# Patient Record
Sex: Female | Born: 1990 | Race: White | Hispanic: No | Marital: Single | Smoking: Current every day smoker
Health system: Southern US, Community
[De-identification: ages and names within clinical notes are randomized; demographics above are authoritative.]

## PROBLEM LIST (undated history)

## (undated) DIAGNOSIS — N83209 Unspecified ovarian cyst, unspecified side: Secondary | ICD-10-CM

## (undated) DIAGNOSIS — J4541 Moderate persistent asthma with (acute) exacerbation: Secondary | ICD-10-CM

---

## 2017-10-28 ENCOUNTER — Emergency Department (HOSPITAL_COMMUNITY)
Admission: EM | Admit: 2017-10-28 | Discharge: 2017-10-28 | Disposition: A | Discharge status: Home / Self Care | Payer: BLUE CROSS/BLUE SHIELD | Attending: Emergency Medicine | Admitting: Emergency Medicine

## 2017-10-28 ENCOUNTER — Other Ambulatory Visit: Payer: Self-pay

## 2017-10-28 ENCOUNTER — Encounter (HOSPITAL_COMMUNITY): Payer: Self-pay | Admitting: Emergency Medicine

## 2017-10-28 DIAGNOSIS — Z973 Presence of spectacles and contact lenses: Secondary | ICD-10-CM | POA: Diagnosis not present

## 2017-10-28 DIAGNOSIS — X58XXXA Exposure to other specified factors, initial encounter: Secondary | ICD-10-CM | POA: Insufficient documentation

## 2017-10-28 DIAGNOSIS — S0502XA Injury of conjunctiva and corneal abrasion without foreign body, left eye, initial encounter: Secondary | ICD-10-CM | POA: Diagnosis not present

## 2017-10-28 DIAGNOSIS — Y998 Other external cause status: Secondary | ICD-10-CM | POA: Diagnosis not present

## 2017-10-28 DIAGNOSIS — S0592XA Unspecified injury of left eye and orbit, initial encounter: Secondary | ICD-10-CM | POA: Diagnosis present

## 2017-10-28 DIAGNOSIS — Y9389 Activity, other specified: Secondary | ICD-10-CM | POA: Diagnosis not present

## 2017-10-28 DIAGNOSIS — Y929 Unspecified place or not applicable: Secondary | ICD-10-CM | POA: Diagnosis not present

## 2017-10-28 HISTORY — DX: Unspecified ovarian cyst, unspecified side: N83.209

## 2017-10-28 MED ORDER — ERYTHROMYCIN 5 MG/GM OP OINT
TOPICAL_OINTMENT | Freq: Once | OPHTHALMIC | Status: AC
  Administered 2017-10-28: 1 via OPHTHALMIC
  Filled 2017-10-28: qty 3.5

## 2017-10-28 MED ORDER — CIPROFLOXACIN HCL 0.3 % OP SOLN
2.0000 [drp] | Freq: Once | OPHTHALMIC | Status: AC
  Administered 2017-10-28: 2 [drp] via OPHTHALMIC
  Filled 2017-10-28: qty 2.5

## 2017-10-28 MED ORDER — FLUORESCEIN SODIUM 1 MG OP STRP
1.0000 | ORAL_STRIP | Freq: Once | OPHTHALMIC | Status: AC
  Administered 2017-10-28: 1 via OPHTHALMIC
  Filled 2017-10-28: qty 1

## 2017-10-28 MED ORDER — TETRACAINE HCL 0.5 % OP SOLN
2.0000 [drp] | Freq: Once | OPHTHALMIC | Status: AC
  Administered 2017-10-28: 2 [drp] via OPHTHALMIC
  Filled 2017-10-28: qty 4

## 2017-10-28 NOTE — ED Triage Notes (Signed)
Pt verbalizes slept in contacts last night and believes scratched her left eye. Redness noted to conjunctiva.

## 2017-10-28 NOTE — ED Notes (Signed)
Eye flushed with LR and Morgan Lens.

## 2017-10-28 NOTE — Discharge Instructions (Addendum)
You were seen here today for left eye pain.  You were  found to have a corneal abrasion.  I have given you ciprofloxacin drops that I would like you to apply 4 times a day (every 6 hours) for the next 5 days.  Please do not wear your contacts in following days.  Please follow-up with the eye doctor provided in 2 days, on 10/30/2017. Please follow attached handout.

## 2017-10-28 NOTE — ED Notes (Signed)
Pt unable to complete

## 2017-10-28 NOTE — ED Provider Notes (Signed)
[facility]-EMERGENCY DEPT Provider Note   CSN: [id number] Arrival date & time: 10/28/17  0945     History   Chief Complaint Chief Complaint  Patient presents with  . Eye Pain    HPI Rachel Morales is a 26 y.o. female who presents to the emergency department today for left eye pain.  Patient notes that she has daily contact and excellently slept in her contacts yesterday.  She awoke this morning with irritation, photophobia and pain in the left eye. She is now having tearing in the eye as a result of the the pain and mild foreign body sensation.  She took out the contact that made her pain worse.  She tried eyedrops that did not relieve her symptoms.  She put back in daily contacts that did not improve her pain.  She is currently having a 7/10 irritating pain. She denies fever, HA, N/V, loss of vision, flashers, floaters, blurring, diplopia, discharge, trauma, rash, pain or painful EOM.    HPI  Past Medical History:  Diagnosis Date  . Ovarian cyst     There are no active problems to display for this patient.   History reviewed. No pertinent surgical history.  OB History    No data available       Home Medications    Prior to Admission medications   Not on File    Family History No family history on file.  Social History Social History   Tobacco Use  . Smoking status: Not on file  Substance Use Topics  . Alcohol use: Not on file  . Drug use: Not on file     Allergies   Tamiflu [oseltamivir]   Review of Systems Review of Systems  Constitutional: Negative for chills and fever.  Eyes: Positive for photophobia, pain, redness and visual disturbance.  Gastrointestinal: Negative for nausea and vomiting.  Neurological: Negative for headaches.     Physical Exam Updated Vital Signs BP 133/77 (BP Location: Left Arm)   Pulse 87   Temp 98.4 F (36.9 C) (Oral)   Resp 16   Ht 5\' 4"  (1.626 m)   Wt 59 kg (130 lb)   LMP 10/07/2017   SpO2  97%   BMI 22.31 kg/m   Physical Exam  Constitutional: She appears well-developed and well-nourished.  HENT:  Head: Normocephalic and atraumatic.  Right Ear: External ear normal.  Left Ear: External ear normal.  No periorbital swelling or erythema  Eyes: Conjunctivae are normal. Lids are everted and swept, no foreign bodies found. Right eye exhibits no discharge. Left eye exhibits no discharge. No scleral icterus.  Appearance. Left eye with scleral erythema. Right eye with no erythema or scleral icterus. No discharge.  PEERL intact. EOMI without nystagmus. Photophobia to left eye. No consensual photophobia.  Corneal Abrasion Exam VCO. Risks, benefits and alternatives explained. 2 drops of tetracaine (PONTOCAINE) 0.5 % ophthalmic solution  were applied to the left eye. Fluorescein 1 MG ophthalmic strip applied the the surface of the left eye Slit lamp used to screen for abrasion. Noted increased fluorescein uptake on the right sclera, near the edge of iris. No corneal ulcer. No hyphema. No dendritic lesion. Negative Seidel sign. No foreign bodies noted. No visible hyphema.  Eye flushed with sterile saline Patient tolerated the procedure well TONOPEN: 18.9 LEFT  Pulmonary/Chest: Effort normal. No respiratory distress.  Neurological: She is alert.  Skin: No pallor.  Psychiatric: She has a normal mood and affect.  Nursing note and vitals reviewed.  ED Treatments / Results  Labs (all labs ordered are listed, but only abnormal results are displayed) Labs Reviewed - No data to display  EKG  EKG Interpretation None       Radiology No results found.  Procedures Procedures (including critical care time)  Medications Ordered in ED Medications  ciprofloxacin (CILOXAN) 0.3 % ophthalmic solution 2 drop (not administered)  tetracaine (PONTOCAINE) 0.5 % ophthalmic solution 2 drop (2 drops Left Eye Given 10/28/17 1025)  fluorescein ophthalmic strip 1 strip (1 strip Left Eye Given  10/28/17 1025)  erythromycin ophthalmic ointment (1 application Left Eye Given 10/28/17 1024)     Initial Impression / Assessment and Plan / ED Course  I have reviewed the triage vital signs and the nursing notes.  Pertinent labs & imaging results that were available during my care of the patient were reviewed by me and considered in my medical decision making (see chart for details).     Corneal abrasion  Pt with corneal abrasion on PE. Eye irrigated w NS, no evidence of FB.  No change in vision, acuity equal bilaterally.  Pt is a contact lens wearer.  Exam non-concerning for orbital cellulitis, hyphema, corneal ulcers. Patient will be discharged home with ciprofloxacin drops (given in the department) for pseudomonas coverage.   Patient understands to follow up with ophthalmology, & to return to ER if new symptoms develop including change in vision, purulent drainage, or entrapment. Appear safe for discharge.   Final Clinical Impressions(s) / ED Diagnoses   Final diagnoses:  Abrasion of left cornea, initial encounter    ED Discharge Orders    None       Princella PellegriniMaczis, Michael M, PA-C 10/28/17 1041    Gerhard MunchLockwood, Robert, MD 10/28/17 1102

## 2017-11-09 ENCOUNTER — Ambulatory Visit
Admission: RE | Admit: 2017-11-09 | Discharge: 2017-11-09 | Disposition: A | Discharge status: Home / Self Care | Payer: Self-pay | Source: Ambulatory Visit | Attending: Family Medicine | Admitting: Family Medicine

## 2017-11-09 ENCOUNTER — Ambulatory Visit: Payer: Self-pay | Admitting: Family Medicine

## 2017-11-09 ENCOUNTER — Encounter: Payer: Self-pay | Admitting: Family Medicine

## 2017-11-09 ENCOUNTER — Ambulatory Visit: Payer: BLUE CROSS/BLUE SHIELD | Admitting: Pharmacist

## 2017-11-09 ENCOUNTER — Other Ambulatory Visit: Payer: Self-pay

## 2017-11-09 VITALS — BP 102/68 | HR 66 | Temp 98.2°F | Ht 64.0 in | Wt 121.6 lb

## 2017-11-09 DIAGNOSIS — Y9315 Activity, underwater diving and snorkeling: Secondary | ICD-10-CM | POA: Insufficient documentation

## 2017-11-09 DIAGNOSIS — Z021 Encounter for pre-employment examination: Secondary | ICD-10-CM

## 2017-11-09 LAB — POCT UA - GLUCOSE/PROTEIN
GLUCOSE UA: NEGATIVE
Protein, UA: 30

## 2017-11-09 LAB — POCT HEMOGLOBIN: Hemoglobin: 14.4 g/dL (ref 12.2–16.2)

## 2017-11-09 NOTE — Progress Notes (Signed)
S:    Patient arrives in good spirits, ambulating without assistance and reporting she is just overcoming a cold and .    Presents for lung function evaluation for "dive physical". Patient reports breathing has recovered.  Reports history of well controlled asthma.    O: Patient provided good effort while attempting spirometry.  FVC 3.22    Calculated Lower Limit for [employer] Diving Standards   FVC = 3.10 FEV1 2.97       Calculated Lower Limit for [employer] Diving Standards   FEV1= 2.66 FEV1/FVC  92.3   Calculated Lower Limit for [employer] Diving Standards   FEV1/FVC = 75.4  See "scanned report" or Documentation Flowsheet (discrete results - PFTs) for  Spirometry results and copy of evaluation.   A/P:  Spirometry evaluation without bronchodilator reveals normal lung function.  FEV1, FVC and FEV1/FVC ratio all exceed threshold for spirometric parameters.

## 2017-11-09 NOTE — Progress Notes (Signed)
Subjective:    Rachel Morales is a 26 y.o. female who [facility] for [facility]:  1.  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  Denies any complications or injuries while diving.  Has never failed a fitness to dive physical.  Currently well, without complaints.  The following portions of the patient's history were reviewed and updated as appropriate: allergies, current medications, past medical history, family and social history, and problem list.  PMH reviewed.  Past Medical History:  Diagnosis Date  . Ovarian cyst    No past surgical history on file.  Medications reviewed. Current Outpatient Medications  Medication Sig Dispense Refill  . ALBUTEROL SULFATE IN Inhale 2 puffs into the lungs as needed.    . fluticasone (FLOVENT HFA) 220 MCG/ACT inhaler Inhale 2 puffs into the lungs 2 (two) times daily.    Marland Kitchen. moxifloxacin (VIGAMOX) 0.5 % ophthalmic solution Place 1 drop into the left eye 2 (two) times daily.    . prednisoLONE acetate (PRED FORTE) 1 % ophthalmic suspension Place 1 drop into the left eye 3 (three) times daily.    . TRI-PREVIFEM 0.18/0.215/0.25 MG-35 MCG tablet Take 1 tablet by mouth daily.     No current facility-administered medications for this visit.      PMH:   - asthma on controller medications.  Uses albuterol once a week at most.   - History of anxiety and depression, currently controlled.   - No other hospitalizations or other prior medical history   PSH: - denies  Family History: - Father with asthma.  Otherwise denies any family history of cardiopulmonary disease.  Social: -Rarely smokes cigarettes on occasion when drinking socially. - Very occasional social drinker (1-2 drinks)  SCUBA ROS:  He denies any history of middle ear trauma/disease, vertigo, ocular surgery,  asthma or other respiratory issues, seizures, loss of consciousness, recurring neurologic disorders, history of head injury, coagulopathies, evidence of CAD or other structural heart disease, pneumothorax, diabetes, or exercise intolerance.    General ROS:  The patient denies fever, unusual weight change, decreased hearing, chest pain, palpitations, pre-syncopal or syncopal episodes, dyspnea on exertion, prolonged cough, hemoptysis, change in bowel habits, melena, hematochezia, severe indigestion/heartburn, nausea/vomiting/abdominal pain, genital sores, muscle weakness, difficulty walking, abnormal bleeding, or enlarged lymph nodes.     Objective:   Physical Exam BP 102/68   Pulse 66   Temp 98.2 F (36.8 C) (Oral)   Ht 5\' 4"  (1.626 m)   Wt 121 lb 9.6 oz (55.2 kg)   LMP 11/09/2017 (Exact Date)   SpO2 98%   BMI 20.87 kg/m  Gen:  Alert, cooperative patient who appears stated age in no acute distress.  Vital signs reviewed. Head:  [place]/AT Eyes:  Fundoscopy WNL BL.  PERRL, EOMI Ears:  External ears WNL, Bilateral TM's normal without retraction, redness or bulging.  Canals clear BL  Mouth:  Good dental hygiene. Tonsils non-erythematous, non-edematous.   MMM Neck:  Trachea midline Cardiac:  Regular rate and rhythm without murmur auscultated.   Pulm:  Clear to auscultation bilaterally with good air movement throuhout.  No wheezes or rales noted.   Abd:  Soft/nondistended Exts: No edema BL LE's, warm and well-perfused Neuro:  Alert and oriented to person, place, and date.  CN II-XII intact.  Sensation intact to light touch and vibration bilateral upper  and lower extremities equally.  Motor function equal and strength 5/5 bilateral upper and lower extremities.  Normal gait.  DTRs +2 BL tricep, brachialis, patellar, and achilles.  Finger to nose cerebellar testing within normal limits.  Color vision testing normal. Psych:  Not depressed or anxious appearing.  Linear and coherent thought process as  evidenced by speech pattern. Smiles spontaneously.

## 2017-11-09 NOTE — Patient Instructions (Signed)
It was good to see you today  [facility] to [facility].  By the time you come back here, I will have seen the results.   Everything looks good thus far.  If the xray is good I will fill out your paperwork.   Have a good holiday and New Year!

## 2017-11-09 NOTE — Assessment & Plan Note (Addendum)
Vision (distance, near, color), hearing, CBG, Hgb, and Spirometry all within normal limits. UA showed 30 protein.  She hasn't had anything to drink today and states she feels very dehydrated.  BP good -- no history of diabetes or HTN. Normal CXR within past 5 years. EKG:  n/a Coronary assessment:  N/a due to age Approval for SCUBA diving, I find no medical conditions considered incompatible with diving. Due to age and lack of medical conditions, she qualifies for 1 year certification.  FU in 1 year for recheck proteinuria.

## 2017-12-13 ENCOUNTER — Encounter: Payer: Self-pay | Admitting: Family Medicine

## 2017-12-13 ENCOUNTER — Other Ambulatory Visit: Payer: Self-pay

## 2017-12-13 ENCOUNTER — Ambulatory Visit (INDEPENDENT_AMBULATORY_CARE_PROVIDER_SITE_OTHER): Payer: Self-pay | Admitting: Family Medicine

## 2017-12-13 VITALS — BP 110/72 | HR 66 | Temp 98.5°F | Ht 66.93 in | Wt 120.6 lb

## 2017-12-13 DIAGNOSIS — R6889 Other general symptoms and signs: Secondary | ICD-10-CM

## 2017-12-13 LAB — POC INFLUENZA A&B (BINAX/QUICKVUE)
Influenza A, POC: NEGATIVE
Influenza B, POC: NEGATIVE

## 2017-12-13 NOTE — Progress Notes (Signed)
1/24/201911:54 AM  Rachel Morales 10-Apr-1991, 27 y.o. female [id number]  Chief Complaint  Patient presents with  . URI    having flu like symptom    HPI:   Patient is a 27 y.o. female who presents today for 2 days of cough, body aches, headaches, cold and hot sweats, nausea and vomiting. Mild sore throat and nasal congestion. No SOB or diarrhea. Tolerating small sips of fluids. Works at a [employer]. Has not had flu vaccine this season. Reminds her of when she had the flu couple of years ago.   Depression screen [facility] 2/9 12/13/2017 11/09/2017  Decreased Interest 0 0  Down, Depressed, Hopeless 0 0  PHQ - 2 Score 0 0    Allergies  Allergen Reactions  . Peanut Oil Other (See Comments)    Reports tongue and throat involvement.  . Tamiflu [Oseltamivir] Other (See Comments)    Dehydration    Prior to Admission medications   Medication Sig Start Date End Date Taking? Authorizing Provider  ALBUTEROL SULFATE IN Inhale 2 puffs into the lungs as needed.    [provider]  fluticasone (FLOVENT HFA) 220 MCG/ACT inhaler Inhale 2 puffs into the lungs 2 (two) times daily.    [provider]  TRI-PREVIFEM 0.18/0.215/0.25 MG-35 MCG tablet Take 1 tablet by mouth daily. 11/07/17   [provider]  Lamotrigene 25mg   1 tablet by mouth once a day  Past Medical History:  Diagnosis Date  . Ovarian cyst     History reviewed. No pertinent surgical history.  Social History   Tobacco Use  . Smoking status: Current Every Day Smoker    Types: Cigarettes  . Smokeless tobacco: Never Used  . Tobacco comment: smokes with alcohol rarely  Substance Use Topics  . Alcohol use: Not on file    Family History  Problem Relation Age of Onset  . Mental illness Mother   . Healthy Father   . Mental illness Sister   . Healthy Brother     ROS Per hpi  OBJECTIVE:  Blood pressure 110/72, pulse 66, temperature 98.5 F (36.9 C), temperature source Oral, height 5' 6.93"  (1.7 m), weight 120 lb 9.6 oz (54.7 kg), last menstrual period 11/29/2017, SpO2 95 %.  Physical Exam  Constitutional: She is oriented to person, place, and time and well-developed, well-nourished, and in no distress. She has a sickly appearance.  HENT:  Head: Normocephalic and atraumatic.  Right Ear: Hearing, tympanic membrane, external ear and ear canal normal.  Left Ear: Hearing, tympanic membrane, external ear and ear canal normal.  Mouth/Throat: Oropharynx is clear and moist.  Eyes: EOM are normal. Pupils are equal, round, and reactive to light.  Neck: Neck supple.  Cardiovascular: Normal rate, regular rhythm and normal heart sounds. Exam reveals no gallop and no friction rub.  No murmur heard. Pulmonary/Chest: Effort normal and breath sounds normal. She has no wheezes. She has no rales.  Abdominal: Soft. Bowel sounds are normal. There is no hepatosplenomegaly. There is tenderness in the epigastric area. There is no rebound and no guarding.  Lymphadenopathy:    She has no cervical adenopathy.  Neurological: She is alert and oriented to person, place, and time. Gait normal.  Skin: Skin is warm and dry. No rash noted.    Results for orders placed or performed in visit on 12/13/17 (from the past 24 hour(s))  POC Influenza A&B(BINAX/QUICKVUE)     Status: None   Collection Time: 12/13/17 12:06 PM  Result Value Ref  Range   Influenza A, POC Negative Negative   Influenza B, POC Negative Negative    ASSESSMENT and PLAN  1. Flu-like symptoms - POC Influenza A&B(BINAX/QUICKVUE)  Return if symptoms worsen or fail to improve.    Myles LippsIrma M Santiago, MD [facility] at [facility] [address] [place], [place] [address] Ph.  [phone] Fax [phone]

## 2017-12-13 NOTE — Patient Instructions (Addendum)
IF you received an x-ray today, you will receive an invoice from [facility]. Please contact [facility] at [phone] with questions or concerns regarding your invoice.   IF you received labwork today, you will receive an invoice from [facility]. Please contact [facility] at [phone] with questions or concerns regarding your invoice.   Our billing staff will not be able to assist you with questions regarding bills from these companies.  You will be contacted with the lab results as soon as they are available. The fastest way to get your results is to activate your My Chart account. Instructions are located on the last page of this paperwork. If you have not heard from [place] regarding the results in 2 weeks, please contact this office.        IF you received an x-ray today, you will receive an invoice from [facility]. Please contact [facility] at [phone] with questions or concerns regarding your invoice.   IF you received labwork today, you will receive an invoice from [facility]. Please contact [facility] at [phone] with questions or concerns regarding your invoice.   Our billing staff will not be able to assist you with questions regarding bills from these companies.  You will be contacted with the lab results as soon as they are available. The fastest way to get your results is to activate your My Chart account. Instructions are located on the last page of this paperwork. If you have not heard from [place] regarding the results in 2 weeks, please contact this office.     Viral Illness, Adult Viruses are tiny germs that can get into a person's body and cause illness. There are many different types of viruses, and they cause many types of illness. Viral illnesses can range from mild to severe. They can affect various parts of the body. Common illnesses that are caused by a virus include colds and the flu. Viral illnesses also include serious  conditions such as HIV/AIDS (human immunodeficiency virus/acquired immunodeficiency syndrome). A few viruses have been linked to certain cancers. What are the causes? Many types of viruses can cause illness. Viruses invade cells in your body, multiply, and cause the infected cells to malfunction or die. When the cell dies, it releases more of the virus. When this happens, you develop symptoms of the illness, and the virus continues to spread to other cells. If the virus takes over the function of the cell, it can cause the cell to divide and grow out of control, as is the case when a virus causes cancer. Different viruses get into the body in different ways. You can get a virus by:  Swallowing food or water that is contaminated with the virus.  Breathing in droplets that have been coughed or sneezed into the air by an infected person.  Touching a surface that has been contaminated with the virus and then touching your eyes, nose, or mouth.  Being bitten by an insect or animal that carries the virus.  Having sexual contact with a person who is infected with the virus.  Being exposed to blood or fluids that contain the virus, either through an open cut or during a transfusion.  If a virus enters your body, your body's defense system (immune system) will try to fight the virus. You may be at higher risk for a viral illness if your immune system is weak. What are the signs or symptoms? Symptoms vary depending on the type of virus and the location of  the cells that it invades. Common symptoms of the main types of viral illnesses include: Cold and flu viruses  Fever.  Headache.  Sore throat.  Muscle aches.  Nasal congestion.  Cough. Digestive system (gastrointestinal) viruses  Fever.  Abdominal pain.  Nausea.  Diarrhea. Liver viruses (hepatitis)  Loss of appetite.  Tiredness.  Yellowing of the skin (jaundice). Brain and spinal cord viruses  Fever.  Headache.  Stiff  neck.  Nausea and vomiting.  Confusion or sleepiness. Skin viruses  Warts.  Itching.  Rash. Sexually transmitted viruses  Discharge.  Swelling.  Redness.  Rash. How is this treated? Viruses can be difficult to treat because they live within cells. Antibiotic medicines do not treat viruses because these drugs do not get inside cells. Treatment for a viral illness may include:  Resting and drinking plenty of fluids.  Medicines to relieve symptoms. These can include over-the-counter medicine for pain and fever, medicines for cough or congestion, and medicines to relieve diarrhea.  Antiviral medicines. These drugs are available only for certain types of viruses. They may help reduce flu symptoms if taken early. There are also many antiviral medicines for hepatitis and HIV/AIDS.  Some viral illnesses can be prevented with vaccinations. A common example is the flu shot. Follow these instructions at home: Medicines   Take over-the-counter and prescription medicines only as told by your health care provider.  If you were prescribed an antiviral medicine, take it as told by your health care provider. Do not stop taking the medicine even if you start to feel better.  Be aware of when antibiotics are needed and when they are not needed. Antibiotics do not treat viruses. If your health care provider thinks that you may have a bacterial infection as well as a viral infection, you may get an antibiotic. ? Do not ask for an antibiotic prescription if you have been diagnosed with a viral illness. That will not make your illness go away faster. ? Frequently taking antibiotics when they are not needed can lead to antibiotic resistance. When this develops, the medicine no longer works against the bacteria that it normally fights. General instructions  Drink enough fluids to keep your urine clear or pale yellow.  Rest as much as possible.  Return to your normal activities as told by your  health care provider. Ask your health care provider what activities are safe for you.  Keep all follow-up visits as told by your health care provider. This is important. How is this prevented? Take these actions to reduce your risk of viral infection:  Eat a healthy diet and get enough rest.  Wash your hands often with soap and water. This is especially important when you are in public places. If soap and water are not available, use hand sanitizer.  Avoid close contact with friends and family who have a viral illness.  If you travel to areas where viral gastrointestinal infection is common, avoid drinking water or eating raw food.  Keep your immunizations up to date. Get a flu shot every year as told by your health care provider.  Do not share toothbrushes, nail clippers, razors, or needles with other people.  Always practice safe sex.  Contact a health care provider if:  You have symptoms of a viral illness that do not go away.  Your symptoms come back after going away.  Your symptoms get worse. Get help right away if:  You have trouble breathing.  You have a severe headache or a stiff  neck.  You have severe vomiting or abdominal pain. This information is not intended to replace advice given to you by your health care provider. Make sure you discuss any questions you have with your health care provider. Document Released: 03/17/2016 Document Revised: 04/19/2016 Document Reviewed: 03/17/2016 Elsevier Interactive Patient Education  [employer].

## 2017-12-14 ENCOUNTER — Ambulatory Visit (INDEPENDENT_AMBULATORY_CARE_PROVIDER_SITE_OTHER): Payer: Self-pay

## 2017-12-14 ENCOUNTER — Encounter: Payer: Self-pay | Admitting: *Deleted

## 2017-12-14 ENCOUNTER — Ambulatory Visit: Payer: Self-pay | Admitting: Family Medicine

## 2017-12-14 ENCOUNTER — Other Ambulatory Visit: Payer: Self-pay

## 2017-12-14 ENCOUNTER — Telehealth: Payer: Self-pay | Admitting: Family Medicine

## 2017-12-14 ENCOUNTER — Encounter: Payer: Self-pay | Admitting: Family Medicine

## 2017-12-14 VITALS — BP 98/84 | HR 98 | Temp 98.2°F | Ht 65.35 in | Wt 119.2 lb

## 2017-12-14 DIAGNOSIS — R05 Cough: Secondary | ICD-10-CM

## 2017-12-14 DIAGNOSIS — J209 Acute bronchitis, unspecified: Secondary | ICD-10-CM

## 2017-12-14 DIAGNOSIS — R059 Cough, unspecified: Secondary | ICD-10-CM

## 2017-12-14 LAB — POCT CBC
Granulocyte percent: 79.7 %G (ref 37–80)
HCT, POC: 36.1 % — AB (ref 37.7–47.9)
Hemoglobin: 12 g/dL — AB (ref 12.2–16.2)
Lymph, poc: 1 (ref 0.6–3.4)
MCH, POC: 29.2 pg (ref 27–31.2)
MCHC: 33.2 g/dL (ref 31.8–35.4)
MCV: 88.1 fL (ref 80–97)
MID (cbc): 0.2 (ref 0–0.9)
MPV: 7.5 fL (ref 0–99.8)
POC Granulocyte: 4.9 (ref 2–6.9)
POC LYMPH PERCENT: 16.8 %L (ref 10–50)
POC MID %: 3.5 %M (ref 0–12)
Platelet Count, POC: 294 10*3/uL (ref 142–424)
RBC: 4.1 M/uL (ref 4.04–5.48)
RDW, POC: 12.9 %
WBC: 6.2 10*3/uL (ref 4.6–10.2)

## 2017-12-14 LAB — POCT URINE PREGNANCY: Preg Test, Ur: NEGATIVE

## 2017-12-14 MED ORDER — ALBUTEROL SULFATE HFA 108 (90 BASE) MCG/ACT IN AERS: 2.0000 | INHALATION_SPRAY | Freq: Four times a day (QID) | RESPIRATORY_TRACT | 0 refills | Status: AC | PRN

## 2017-12-14 MED ORDER — GUAIFENESIN-CODEINE 100-10 MG/5ML PO SOLN: 5.0000 mL | Freq: Three times a day (TID) | ORAL | 0 refills | Status: AC | PRN

## 2017-12-14 NOTE — Patient Instructions (Addendum)
   IF you received an x-ray today, you will receive an invoice from [facility]. Please contact [facility] at [phone] with questions or concerns regarding your invoice.   IF you received labwork today, you will receive an invoice from [facility]. Please contact [facility] at [phone] with questions or concerns regarding your invoice.   Our billing staff will not be able to assist you with questions regarding bills from these companies.  You will be contacted with the lab results as soon as they are available. The fastest way to get your results is to activate your My Chart account. Instructions are located on the last page of this paperwork. If you have not heard from us regarding the results in 2 weeks, please contact this office.     Acute Bronchitis, Adult Acute bronchitis is sudden (acute) swelling of the air tubes (bronchi) in the lungs. Acute bronchitis causes these tubes to fill with mucus, which can make it hard to breathe. It can also cause coughing or wheezing. In adults, acute bronchitis usually goes away within 2 weeks. A cough caused by bronchitis may last up to 3 weeks. Smoking, allergies, and asthma can make the condition worse. Repeated episodes of bronchitis may cause further lung problems, such as chronic obstructive pulmonary disease (COPD). What are the causes? This condition can be caused by germs and by substances that irritate the lungs, including:  Cold and flu viruses. This condition is most often caused by the same virus that causes a cold.  Bacteria.  Exposure to tobacco smoke, dust, fumes, and air pollution.  What increases the risk? This condition is more likely to develop in people who:  Have close contact with someone with acute bronchitis.  Are exposed to lung irritants, such as tobacco smoke, dust, fumes, and vapors.  Have a weak immune system.  Have a respiratory condition such as asthma.  What are the signs or  symptoms? Symptoms of this condition include:  A cough.  Coughing up clear, yellow, or green mucus.  Wheezing.  Chest congestion.  Shortness of breath.  A fever.  Body aches.  Chills.  A sore throat.  How is this diagnosed? This condition is usually diagnosed with a physical exam. During the exam, your health care provider may order tests, such as chest X-rays, to rule out other conditions. He or she may also:  Test a sample of your mucus for bacterial infection.  Check the level of oxygen in your blood. This is done to check for pneumonia.  Do a chest X-ray or lung function testing to rule out pneumonia and other conditions.  Perform blood tests.  Your health care provider will also ask about your symptoms and medical history. How is this treated? Most cases of acute bronchitis clear up over time without treatment. Your health care provider may recommend:  Drinking more fluids. Drinking more makes your mucus thinner, which may make it easier to breathe.  Taking a medicine for a fever or cough.  Taking an antibiotic medicine.  Using an inhaler to help improve shortness of breath and to control a cough.  Using a cool mist vaporizer or humidifier to make it easier to breathe.  Follow these instructions at home: Medicines  Take over-the-counter and prescription medicines only as told by your health care provider.  If you were prescribed an antibiotic, take it as told by your health care provider. Do not stop taking the antibiotic even if you start to feel better. General instructions    Get plenty of rest.  Drink enough fluids to keep your urine clear or pale yellow.  Avoid smoking and secondhand smoke. Exposure to cigarette smoke or irritating chemicals will make bronchitis worse. If you smoke and you need help quitting, ask your health care provider. Quitting smoking will help your lungs heal faster.  Use an inhaler, cool mist vaporizer, or humidifier as told  by your health care provider.  Keep all follow-up visits as told by your health care provider. This is important. How is this prevented? To lower your risk of getting this condition again:  Wash your hands often with soap and water. If soap and water are not available, use hand sanitizer.  Avoid contact with people who have cold symptoms.  Try not to touch your hands to your mouth, nose, or eyes.  Make sure to get the flu shot every year.  Contact a health care provider if:  Your symptoms do not improve in 2 weeks of treatment. Get help right away if:  You cough up blood.  You have chest pain.  You have severe shortness of breath.  You become dehydrated.  You faint or keep feeling like you are going to faint.  You keep vomiting.  You have a severe headache.  Your fever or chills gets worse. This information is not intended to replace advice given to you by your health care provider. Make sure you discuss any questions you have with your health care provider. Document Released: 12/14/2004 Document Revised: 05/31/2016 Document Reviewed: 04/26/2016 Elsevier Interactive Patient Education  2018 [employer]   

## 2017-12-14 NOTE — Telephone Encounter (Signed)
Left  Message    On voice  Mail  To  Call  Back   And   Discuss   Symptoms

## 2017-12-14 NOTE — Telephone Encounter (Signed)
Returned  Pt   Call    Pt  Reports  Issue  Resolved  She  States  She  Was  Seen  Again

## 2017-12-14 NOTE — Telephone Encounter (Signed)
This encounter was created in error - please disregard.

## 2017-12-14 NOTE — Progress Notes (Signed)
1/25/201912:23 PM  Rachel Morales Mar 17, 1991, 27 y.o. female [id number]  Chief Complaint  Patient presents with  . Cough    fatigue, sob, taking Nyquil for symptoms    HPI:   Patient is a 27 y.o. female who presents today for worsening cough last night, which reminds her of previous bronchitis. She was seen yesterday for URI concerns. Flu negative. However patient states that amount of coughing last night was significant with mild SOB. Also feeling very tired with all the coughing. She denies fever or chills. Nyquil not helping much.   [facility] 12/14/2017 12/13/2017 11/09/2017  Decreased Interest 0 0 0  Down, Depressed, Hopeless 0 0 0  PHQ - 2 Score 0 0 0    Allergies  Allergen Reactions  . Peanut Oil Other (See Comments)    Reports tongue and throat involvement.  . Tamiflu [Oseltamivir] Other (See Comments)    Dehydration    Prior to Admission medications   Medication Sig Start Date End Date Taking? Authorizing Provider  ALBUTEROL SULFATE IN Inhale 2 puffs into the lungs as needed.    [provider]  fluticasone (FLOVENT HFA) 220 MCG/ACT inhaler Inhale 2 puffs into the lungs 2 (two) times daily.    [provider]  lamoTRIgine (LAMICTAL) 25 MG tablet Take 25 mg by mouth daily. 11/29/17   [provider]  TRI-PREVIFEM 0.18/0.215/0.25 MG-35 MCG tablet Take 1 tablet by mouth daily. 11/07/17   [provider]    Past Medical History:  Diagnosis Date  . Ovarian cyst     History reviewed. No pertinent surgical history.  Social History   Tobacco Use  . Smoking status: Current Every Day Smoker    Types: Cigarettes  . Smokeless tobacco: Never Used  . Tobacco comment: smokes with alcohol rarely  Substance Use Topics  . Alcohol use: Not on file    Family History  Problem Relation Age of Onset  . Mental illness Mother   . Healthy Father   . Mental illness Sister   . Healthy Brother     ROS Per  hpi  OBJECTIVE:  Blood pressure 98/84, pulse 98, temperature 98.2 F (36.8 C), temperature source Oral, height 5' 5.35" (1.66 m), weight 119 lb 3.2 oz (54.1 kg), last menstrual period 11/29/2017, SpO2 100 %.  Physical Exam  Constitutional: She is oriented to person, place, and time and well-developed, well-nourished, and in no distress.  HENT:  Head: Normocephalic and atraumatic.  Right Ear: Hearing, tympanic membrane, external ear and ear canal normal.  Left Ear: Hearing, tympanic membrane, external ear and ear canal normal.  Mouth/Throat: Oropharynx is clear and moist.  Eyes: EOM are normal. Pupils are equal, round, and reactive to light.  Neck: Neck supple.  Cardiovascular: Normal rate, regular rhythm and normal heart sounds. Exam reveals no gallop and no friction rub.  No murmur heard. Pulmonary/Chest: Effort normal and breath sounds normal. She has no wheezes. She has no rales.  Lymphadenopathy:    She has no cervical adenopathy.  Neurological: She is alert and oriented to person, place, and time. Gait normal.  Skin: Skin is warm and dry.      Results for orders placed or performed in visit on 12/14/17  POCT CBC  Result Value Ref Range   WBC 6.2 4.6 - 10.2 K/uL   Lymph, poc 1.0 0.6 - 3.4   POC LYMPH PERCENT 16.8 10 - 50 %L   MID (cbc) 0.2 0 - 0.9   POC  MID % 3.5 0 - 12 %M   POC Granulocyte 4.9 2 - 6.9   Granulocyte percent 79.7 37 - 80 %G   RBC 4.10 4.04 - 5.48 M/uL   Hemoglobin 12.0 (A) 12.2 - 16.2 g/dL   HCT, POC 11.9 (A) 14.7 - 47.9 %   MCV 88.1 80 - 97 fL   MCH, POC 29.2 27 - 31.2 pg   MCHC 33.2 31.8 - 35.4 g/dL   RDW, POC 82.9 %   Platelet Count, POC 294 142 - 424 K/uL   MPV 7.5 0 - 99.8 fL  POCT urine pregnancy  Result Value Ref Range   Preg Test, Ur Negative Negative    Dg Chest 2 View  Result Date: 12/14/2017 CLINICAL DATA:  Progressive cough. EXAM: CHEST  2 VIEW COMPARISON:  10/20/2017 FINDINGS: The heart size and mediastinal contours are within  normal limits. Both lungs are clear except for slight peribronchial thickening. The visualized skeletal structures are unremarkable. IMPRESSION: Slight bronchitic changes. Electronically Signed   By: Francene Boyers M.D.   On: 12/14/2017 12:43     ASSESSMENT and PLAN 1. Acute bronchitis, unspecified organism Discussed supportive measures, new meds r/se/b and RTC precautions. Patient educational handout given.  2. Cough - POCT CBC - DG Chest 2 View; Future - POCT urine pregnancy  Other orders - guaiFENesin-codeine 100-10 MG/5ML syrup; Take 5 mLs by mouth 3 (three) times daily as needed for cough. - albuterol (PROVENTIL HFA;VENTOLIN HFA) 108 (90 Base) MCG/ACT inhaler; Inhale 2 puffs into the lungs every 6 (six) hours as needed for wheezing or shortness of breath.  Return if symptoms worsen or fail to improve.    Myles Lipps, MD Primary Care at [facility] [address] [place], [place] [address] Ph.  [phone] Fax [phone]

## 2017-12-20 ENCOUNTER — Encounter: Payer: Self-pay | Admitting: General Practice

## 2019-04-19 IMAGING — CR DG CHEST 2V
2 series · 2 of 2 positions shown · non-contrast
Comparison: None.

CLINICAL DATA: No chest complaints, scuba activities, hx asthma

EXAM:
CHEST  2 VIEW

[w chest pa]
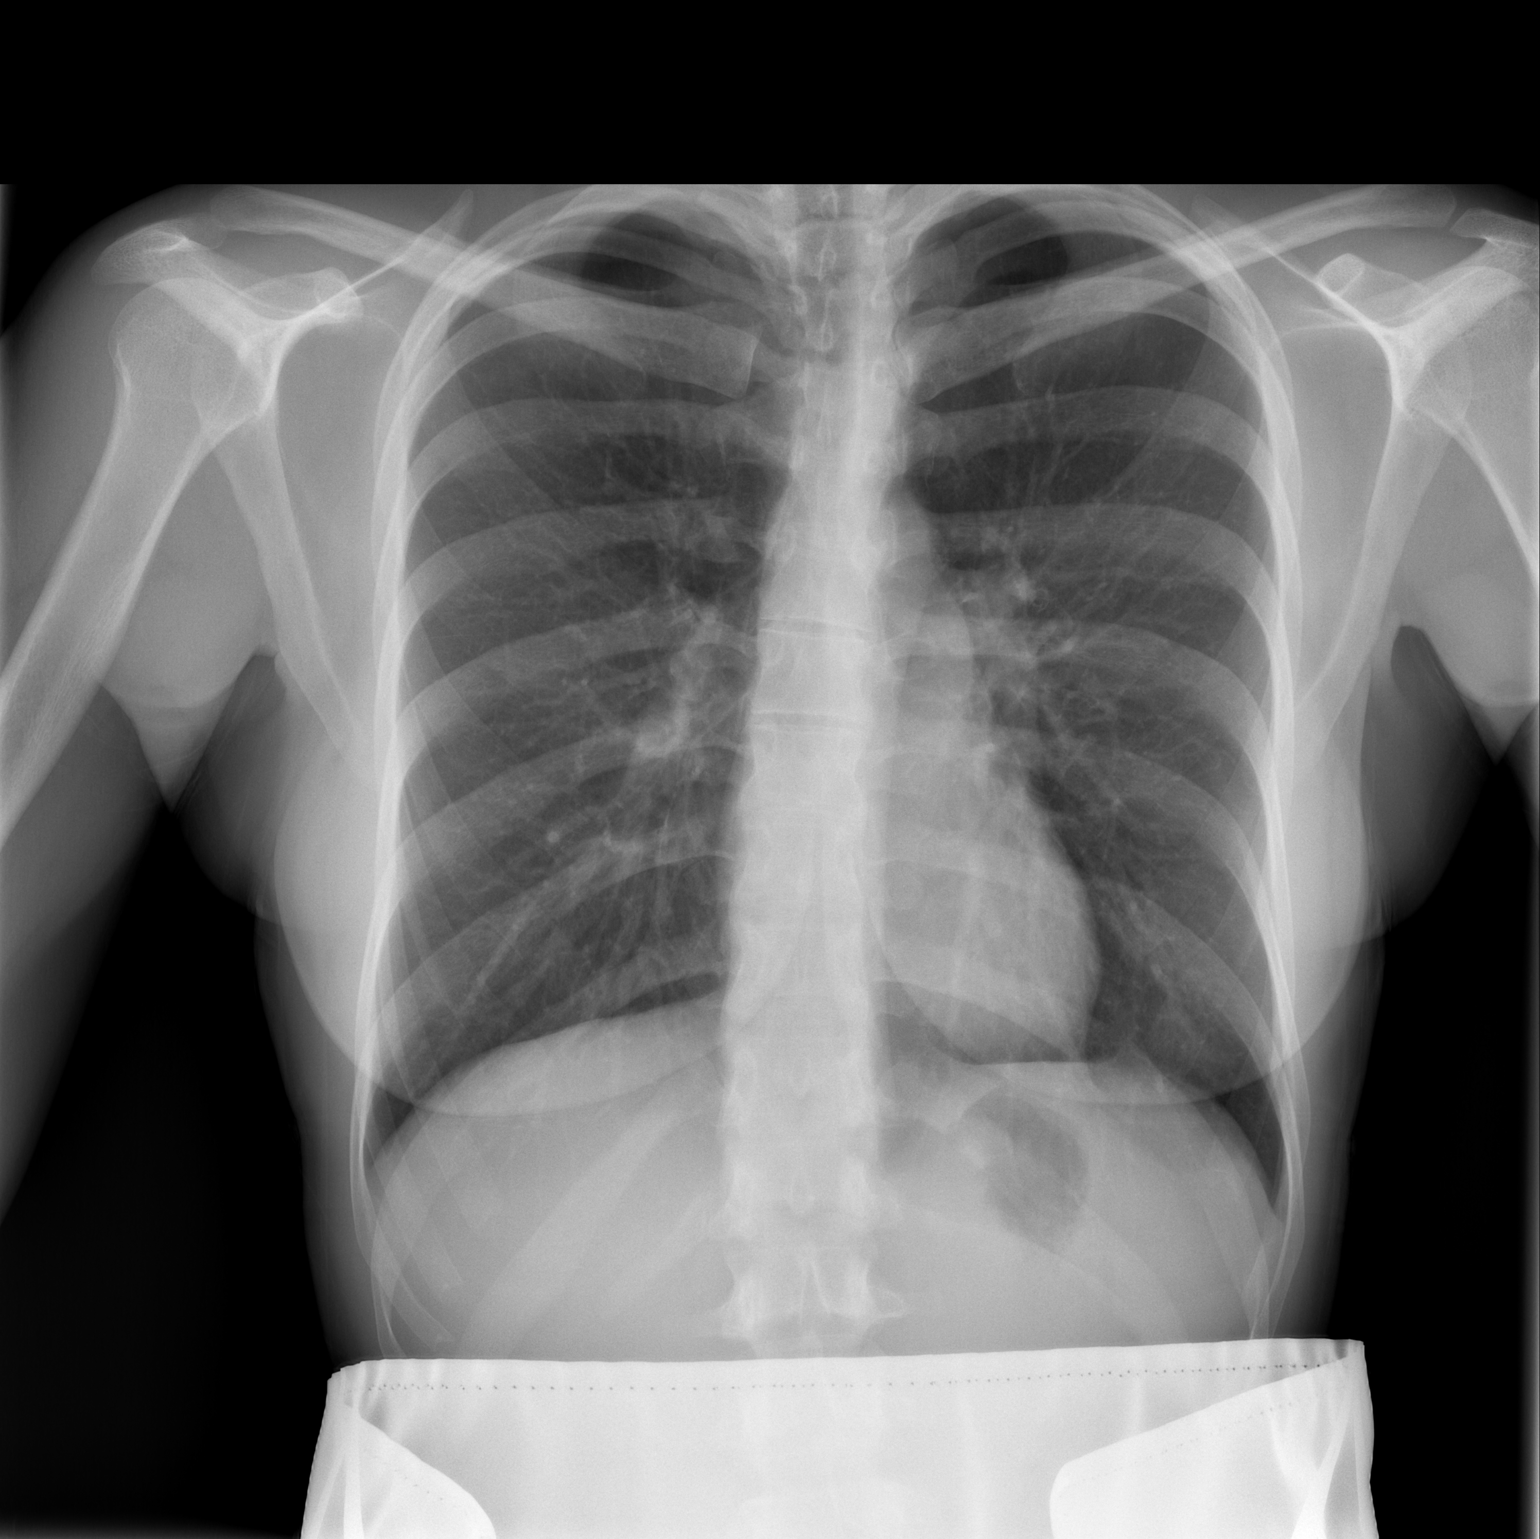

[w chest lat]
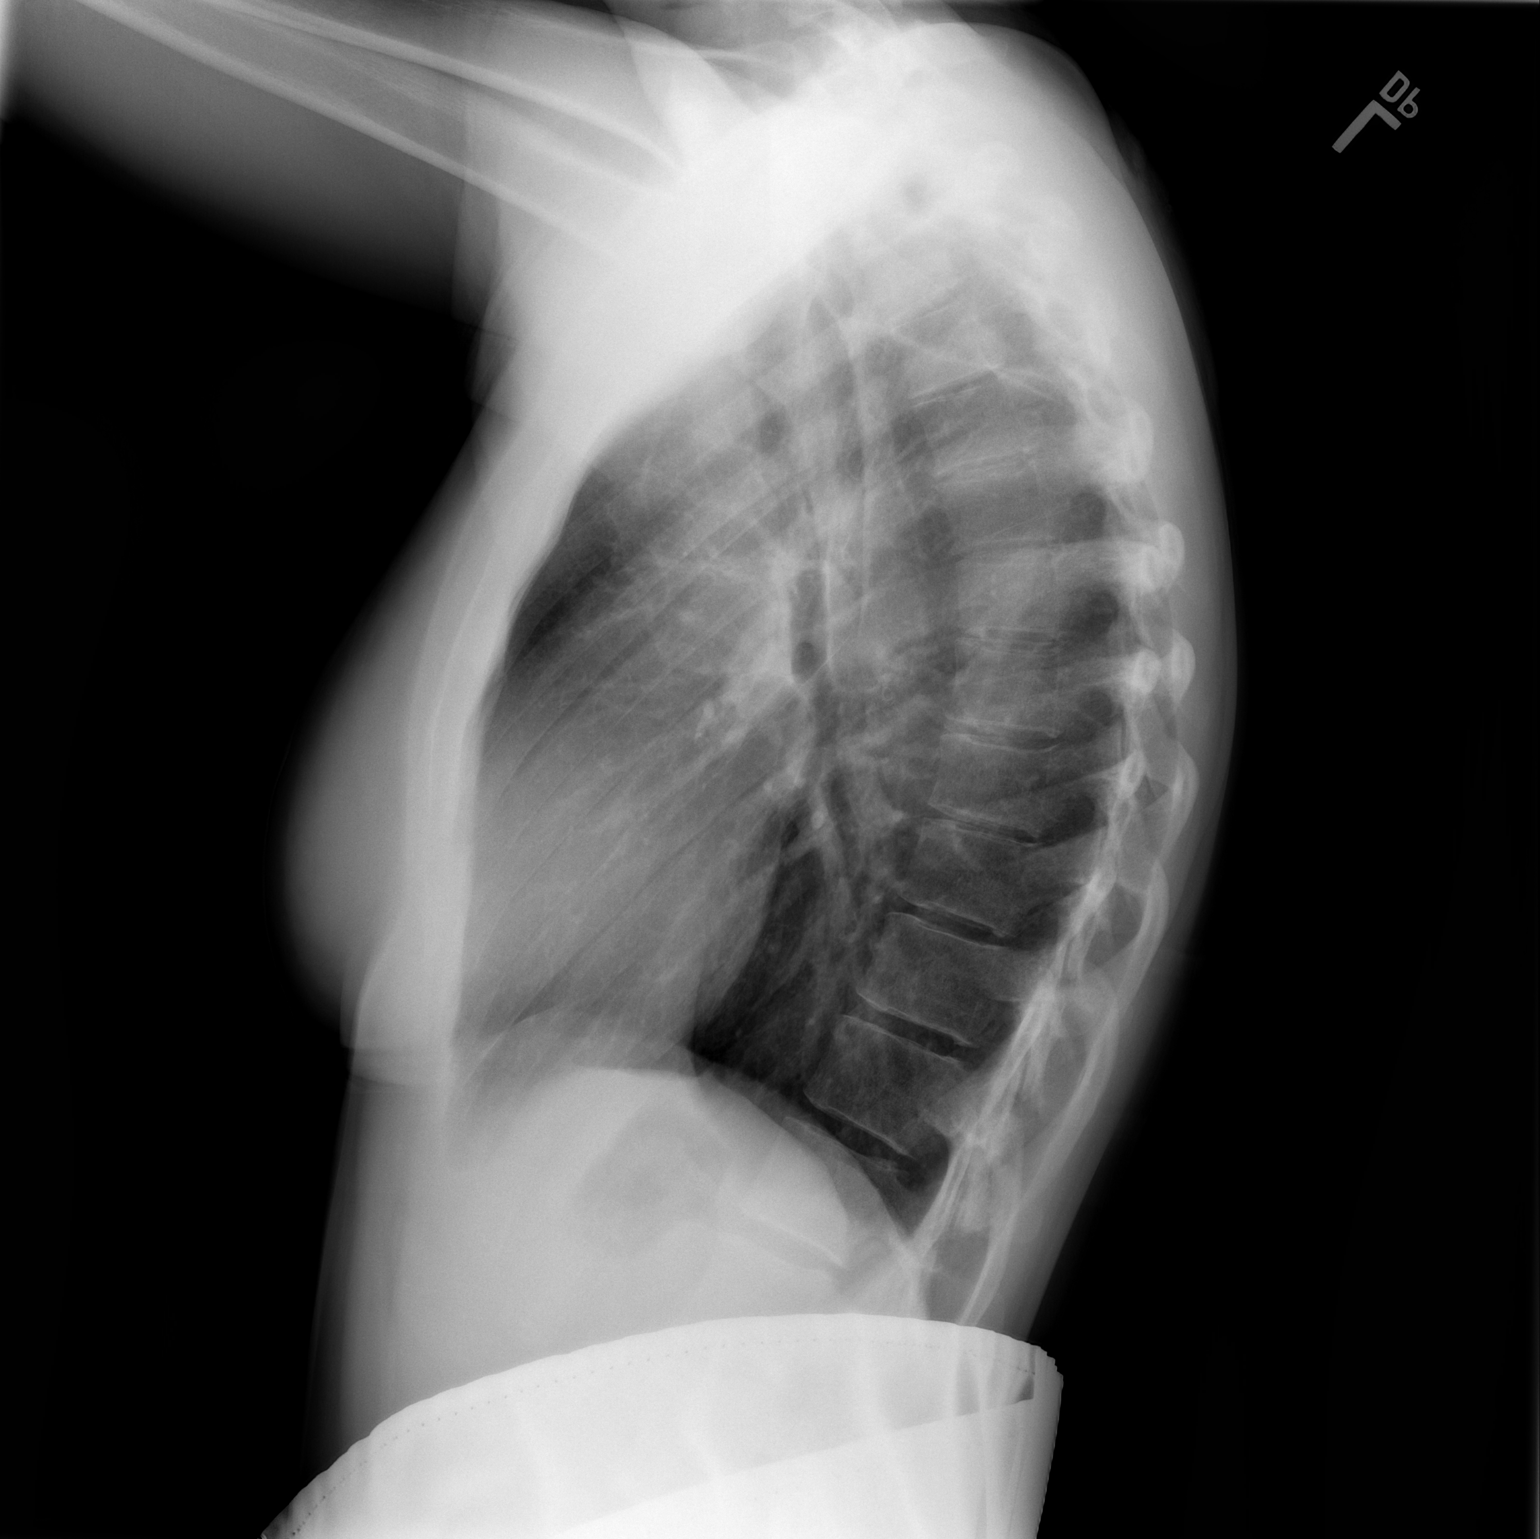

[2 of 2 positions shown; findings below may reference images not displayed]

FINDINGS: The heart size and mediastinal contours are within normal limits.
Both lungs are clear. No pleural effusion or pneumothorax. The
visualized skeletal structures are unremarkable.
IMPRESSION: Normal chest radiographs.

## 2019-05-24 IMAGING — DX DG CHEST 2V
2 series · 2 of 2 positions shown · non-contrast
Comparison: 10/20/2017

CLINICAL DATA: Progressive cough.

EXAM:
CHEST  2 VIEW

[chest pa]
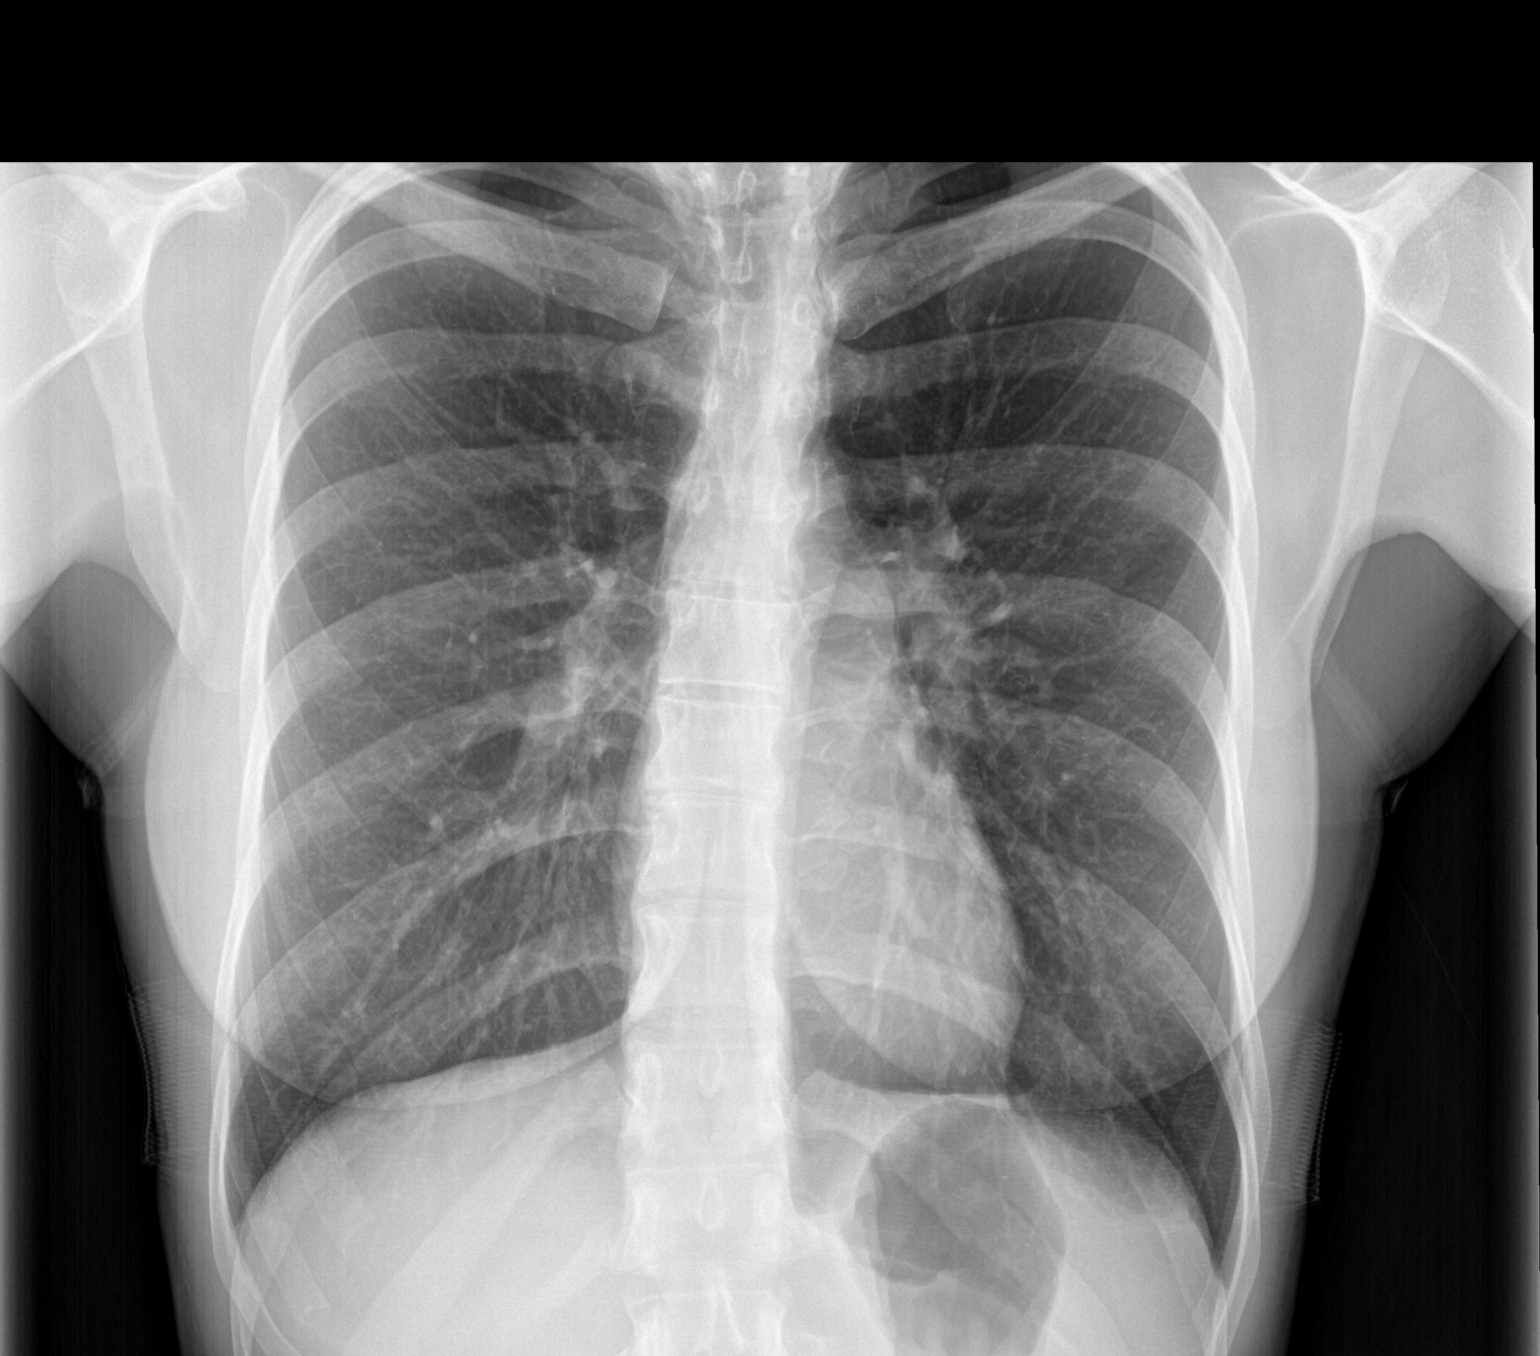

[chest lat]
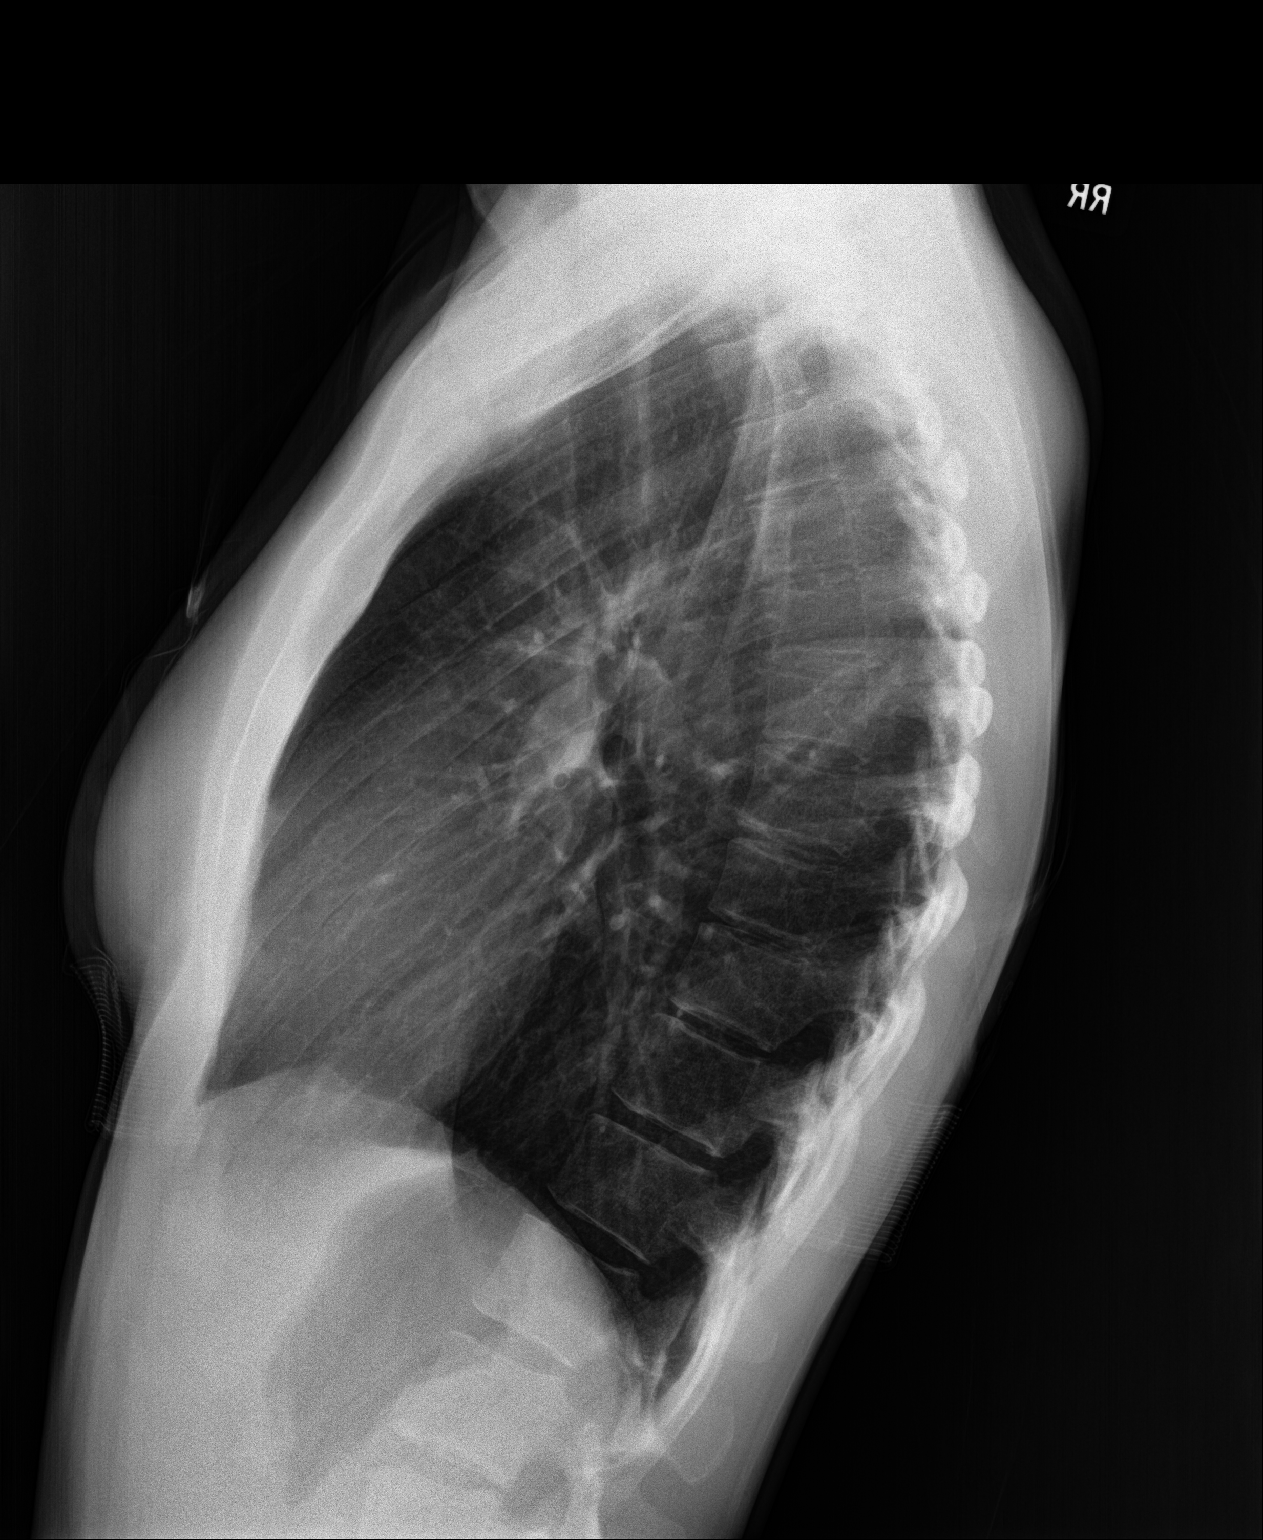

[2 of 2 positions shown; findings below may reference images not displayed]

FINDINGS: The heart size and mediastinal contours are within normal limits.
Both lungs are clear except for slight peribronchial thickening. The
visualized skeletal structures are unremarkable.
IMPRESSION: Slight bronchitic changes.

## 2021-10-08 ENCOUNTER — Ambulatory Visit: Admit: 2021-10-08 | Discharge: 2021-10-08 | Payer: BLUE CROSS/BLUE SHIELD | Attending: Family Medicine

## 2021-10-08 DIAGNOSIS — J069 Acute upper respiratory infection, unspecified: Secondary | ICD-10-CM

## 2021-10-08 LAB — POC COVID-19 & INFLUENZA COMBO (LIAT IN HOUSE)
INFLUENZA A: NOT DETECTED
INFLUENZA B: NOT DETECTED
SARS-CoV-2: NOT DETECTED

## 2021-10-08 MED ORDER — FLUTICASONE PROPIONATE HFA 220 MCG/ACT IN AERO
220 MCG/ACT | Freq: Two times a day (BID) | RESPIRATORY_TRACT | 3 refills | Status: AC
Start: 2021-10-08 — End: 2022-10-08

## 2021-10-08 MED ORDER — METHYLPREDNISOLONE 4 MG PO TBPK
4 MG | PACK | ORAL | 0 refills | Status: AC
Start: 2021-10-08 — End: 2021-10-20

## 2021-10-08 MED ORDER — ALBUTEROL SULFATE HFA 108 (90 BASE) MCG/ACT IN AERS
108 (90 Base) MCG/ACT | Freq: Four times a day (QID) | RESPIRATORY_TRACT | 0 refills | Status: AC | PRN
Start: 2021-10-08 — End: ?

## 2021-10-08 NOTE — Progress Notes (Signed)
Jennifer Villegas is a 30 y.o. female presents today with   Chief Complaint   Patient presents with    Cough     Laryngitis.  History of bronchitis. Symptoms started Thursday.      Marland Kitchen    HPI URI sx for the past week.  Sx includes cough, laryngitis.  No fever.  Pt is currently taking nyquil at bedtime and nothing during the day.  Pt reports some sob and wheezing.  Pt does have a hx of asthma.  Pt is currently using flovent bid and has used her albuterol a couple of times.      Current Outpatient Medications   Medication Sig Dispense Refill    methylphenidate (RITALIN) 10 MG tablet TAKE 1 TABLET BY MOUTH TWICE A DAY ON EMPTY STOMACH      TRI-LO-MARZIA 0.18/0.215/0.25 MG-25 MCG TABS TAKE 1 TABLET BY MOUTH EVERY DAY      FLOVENT HFA 220 MCG/ACT inhaler INHALE 1 PUFF BY MOUTH 2 TIMES A DAY RINSE MOUTH WITH WATER AFTER USE. DON'T SWALLOW.      escitalopram (LEXAPRO) 20 MG tablet TAKE 1 TABLET BY MOUTH EVERY DAY      fluticasone (FLOVENT HFA) 220 MCG/ACT inhaler Inhale 2 puffs into the lungs 2 times daily 1 each 3    albuterol sulfate HFA (VENTOLIN HFA) 108 (90 Base) MCG/ACT inhaler Inhale 2 puffs into the lungs 4 times daily as needed for Wheezing 18 g 0    methylPREDNISolone (MEDROL DOSEPACK) 4 MG tablet Take 1 tablet by mouth See Admin Instructions for 6 days, THEN 1 tablet See Admin Instructions for 6 days. Take by mouth. Taper as directed on box.  1 DOSE PACK. 1 kit 0     No current facility-administered medications for this visit.        Allergies   Allergen Reactions    Tamiflu [Oseltamivir]         Past Medical History:   Diagnosis Date    ADHD (attention deficit hyperactivity disorder)     Anxiety     Asthma     Depression         Past Surgical History:   Procedure Laterality Date    EYE SURGERY      WISDOM TOOTH EXTRACTION          Social History     Socioeconomic History    Marital status: Single     Spouse name: Not on file    Number of children: Not on file    Years of education: Not on file    Highest education level:  Not on file   Occupational History    Not on file   Tobacco Use    Smoking status: Never    Smokeless tobacco: Never   Vaping Use    Vaping Use: Some days    Substances: Nicotine    Devices: Disposable   Substance and Sexual Activity    Alcohol use: Yes     Alcohol/week: 1.0 standard drink     Types: 1 Shots of liquor per week     Comment: socially    Drug use: Never    Sexual activity: Not on file   Other Topics Concern    Not on file   Social History Narrative    Not on file     Social Determinants of Health     Financial Resource Strain: Not on file   Food Insecurity: Not on file   Transportation Needs: Not on file  Physical Activity: Not on file   Stress: Not on file   Social Connections: Not on file   Intimate Partner Violence: Not on file   Housing Stability: Not on file        Review of Systems   Constitutional:  Positive for fatigue. Negative for fever.   HENT:  Positive for congestion, rhinorrhea, sneezing, sore throat and voice change. Negative for ear pain, sinus pressure and sinus pain.    Respiratory:  Positive for cough, shortness of breath and wheezing.       BP 114/70    Pulse 67    Temp 98.1 ??F (36.7 ??C)    Resp 16    Wt 130 lb (59 kg)    LMP 09/07/2021 (Approximate)    SpO2 98%      Physical Exam  Vitals and nursing note reviewed.   Constitutional:       Appearance: Normal appearance.   HENT:      Right Ear: Tympanic membrane, ear canal and external ear normal.      Left Ear: Tympanic membrane, ear canal and external ear normal.      Nose: Congestion and rhinorrhea present.      Mouth/Throat:      Mouth: Mucous membranes are moist.      Pharynx: Posterior oropharyngeal erythema present. No oropharyngeal exudate.   Eyes:      Conjunctiva/sclera: Conjunctivae normal.   Cardiovascular:      Rate and Rhythm: Normal rate and regular rhythm.      Heart sounds: Normal heart sounds.   Pulmonary:      Effort: Pulmonary effort is normal.      Breath sounds: Wheezing present. No rhonchi.   Musculoskeletal:       Cervical back: Normal range of motion and neck supple.   Skin:     General: Skin is warm and dry.      Findings: No rash.   Neurological:      General: No focal deficit present.      Mental Status: She is alert and oriented to person, place, and time.   Psychiatric:         Mood and Affect: Mood normal.         Behavior: Behavior normal.        1. Acute upper respiratory infection  2. Acute cough  -     POC COVID-19 & Influenza Combo ([facility])  3. Moderate persistent asthma with acute exacerbation  -     fluticasone (FLOVENT HFA) 220 MCG/ACT inhaler; Inhale 2 puffs into the lungs 2 times daily, Disp-1 each, R-3Normal  -     albuterol sulfate HFA (VENTOLIN HFA) 108 (90 Base) MCG/ACT inhaler; Inhale 2 puffs into the lungs 4 times daily as needed for Wheezing, Disp-18 g, R-0Normal  -     methylPREDNISolone (MEDROL DOSEPACK) 4 MG tablet; Take 1 tablet by mouth See Admin Instructions for 6 days, THEN 1 tablet See Admin Instructions for 6 days. Take by mouth. Taper as directed on box.  1 DOSE PACK., Disp-1 kit, R-0Normal       Visit Diagnoses         Codes    Acute upper respiratory infection    -  Primary J06.9    Acute cough     R05.1    Moderate persistent asthma with acute exacerbation     J45.41             Results for orders placed  or performed in visit on 10/08/21   POC COVID-19 & Influenza Combo ([facility] in [facility])   Result Value Ref Range    SARS-CoV-2 Not Detected Not Detected    INFLUENZA A Not Detected Not Detected    INFLUENZA B Not Detected Not Detected    Narrative    Is this test for diagnosis or screening?->Diagnosis of ill patient  Symptomatic for COVID-19 as defined by CDC?->Unknown  Date of Symptom Onset->N/A  Hospitalized for COVID-19?->Unknown  Admitted to ICU for COVID-19?->Unknown  Employed in healthcare setting?->Unknown  Resident in a congregate (group) care setting?->Unknown  Pregnant?->Unknown  Previously tested for COVID-19?->Unknown      URI  An upper respiratory infection is also  called the common cold. It is an infection that can affect your nose, throat, ears, and sinuses. For healthy people, the common cold is usually not serious and does not need special treatment. Cold symptoms are usually worst for the first 3 to 5 days. Most people get better in 7 to 14 days. You may continue to cough for 2 to 3 weeks. Colds are caused by viruses and do not get better with antibiotics.  DISCHARGE INSTRUCTIONS:  Seek care immediately if:  You have chest pain or trouble breathing.  Contact your healthcare provider if:  You have a fever over 102F (39C).  Your sore throat gets worse or you see white or yellow spots in your throat.  Your symptoms get worse after 3 to 5 days or your cold is not better in 14 days.  You have a rash anywhere on your skin.  You have large, tender lumps in your neck.  You have thick, green, or yellow drainage from your nose.  You cough up thick yellow, green, or bloody mucus.  You are vomiting for more than 24 hours and cannot keep fluids down.  You have a bad earache.  You have questions or concerns about your condition or care.    Medicines:  You may need any of the following:    Decongestants help reduce nasal congestion and help you breathe more easily. If you take decongestant pills, they may make you feel restless or cause problems with your sleep. Do not use decongestant sprays for more than a few days.  Cough suppressants help reduce coughing. Ask your healthcare provider which type of cough medicine is best for you.  NSAIDs , such as ibuprofen, help decrease swelling, pain, and fever. NSAIDs can cause stomach bleeding or kidney problems in certain people. If you take blood thinner medicine, always ask your healthcare provider if NSAIDs are safe for you. Always read the medicine label and follow directions.  Acetaminophen decreases pain and fever. It is available without a doctor''s order. Ask how much to take and how often to take it. Follow directions. Read the labels  of all other medicines you are using to see if they also contain acetaminophen, or ask your doctor or pharmacist. Acetaminophen can cause liver damage if not taken correctly. Do not use more than 4 grams (4,000 milligrams) total of acetaminophen in one day.  Take your medicine as directed. Contact your healthcare provider if you think your medicine is not helping or if you have side effects. Tell him or her if you are allergic to any medicine. Keep a list of the medicines, vitamins, and herbs you take. Include the amounts, and when and why you take them. Bring the list or the pill bottles to follow-up visits. Carry your medicine list with you  in case of an emergency.    Follow up with your healthcare provider as directed:  Write down your questions so you remember to ask them during your visits.  Self-care:  Rest as much as possible. Slowly start to do more each day.  Drink more liquids as directed. Liquids will help thin and loosen mucus so you can cough it up. Liquids will also help prevent dehydration. Liquids that help prevent dehydration include water, fruit juice, and broth. Do not drink liquids that contain caffeine. Caffeine can increase your risk for dehydration. Ask your healthcare provider how much liquid to drink each day.  Soothe a sore throat. Gargle with warm salt water. This helps your sore throat feel better. Make salt water by dissolving 1/4 teaspoon salt in 1 cup warm water. You may also suck on hard candy or throat lozenges. You may use a sore throat spray.  Use a humidifier or vaporizer. Use a cool mist humidifier or a vaporizer to increase air moisture in your home. This may make it easier for you to breathe and help decrease your cough.  Use saline nasal drops as directed. These help relieve congestion.  Apply petroleum-based jelly around the outside of your nostrils. This can decrease irritation from blowing your nose.  Do not smoke. Nicotine and other chemicals in cigarettes and cigars can  make your symptoms worse. They can also cause infections such as bronchitis or pneumonia. Ask your healthcare provider for information if you currently smoke and need help to quit. E-cigarettes or smokeless tobacco still contain nicotine. Talk to your healthcare provider before you use these products.  Prevent spreading your cold to others:  Try to stay away from other people during the first 2 to 3 days of your cold when it is more easily spread.  Do not share food or drinks.  Do not share hand towels with household members.  Wash your hands often, especially after you blow your nose. Turn away from other people and cover your mouth and nose with a tissue when you sneeze or cough.    Return if symptoms worsen or fail to improve.     Mervin Kung, DO

## 2021-10-30 ENCOUNTER — Encounter
